# Patient Record
Sex: Female | Born: 1960 | Race: Black or African American | Hispanic: No | Smoking: Never smoker
Health system: Southern US, Community
[De-identification: ages and names within clinical notes are randomized; demographics above are authoritative.]

## PROBLEM LIST (undated history)

## (undated) DIAGNOSIS — I10 Essential (primary) hypertension: Secondary | ICD-10-CM

## (undated) DIAGNOSIS — E119 Type 2 diabetes mellitus without complications: Secondary | ICD-10-CM

## (undated) HISTORY — PX: PARTIAL HYSTERECTOMY: SHX80

---

## 2013-02-07 ENCOUNTER — Emergency Department: Payer: Self-pay | Admitting: Emergency Medicine

## 2014-10-16 ENCOUNTER — Ambulatory Visit: Payer: Self-pay | Admitting: Internal Medicine

## 2015-08-12 ENCOUNTER — Encounter: Payer: Self-pay | Admitting: Emergency Medicine

## 2015-08-12 ENCOUNTER — Emergency Department
Admission: EM | Admit: 2015-08-12 | Discharge: 2015-08-12 | Disposition: A | Discharge status: Home / Self Care | Payer: PRIVATE HEALTH INSURANCE | Attending: Emergency Medicine | Admitting: Emergency Medicine

## 2015-08-12 DIAGNOSIS — S3992XA Unspecified injury of lower back, initial encounter: Secondary | ICD-10-CM | POA: Diagnosis present

## 2015-08-12 DIAGNOSIS — S39012A Strain of muscle, fascia and tendon of lower back, initial encounter: Secondary | ICD-10-CM

## 2015-08-12 DIAGNOSIS — Y92410 Unspecified street and highway as the place of occurrence of the external cause: Secondary | ICD-10-CM | POA: Insufficient documentation

## 2015-08-12 DIAGNOSIS — Y998 Other external cause status: Secondary | ICD-10-CM | POA: Insufficient documentation

## 2015-08-12 DIAGNOSIS — I10 Essential (primary) hypertension: Secondary | ICD-10-CM | POA: Insufficient documentation

## 2015-08-12 DIAGNOSIS — Y9389 Activity, other specified: Secondary | ICD-10-CM | POA: Diagnosis not present

## 2015-08-12 DIAGNOSIS — E119 Type 2 diabetes mellitus without complications: Secondary | ICD-10-CM | POA: Diagnosis not present

## 2015-08-12 HISTORY — DX: Essential (primary) hypertension: I10

## 2015-08-12 HISTORY — DX: Type 2 diabetes mellitus without complications: E11.9

## 2015-08-12 MED ORDER — CYCLOBENZAPRINE HCL 10 MG PO TABS: 10.0000 mg | ORAL_TABLET | Freq: Three times a day (TID) | ORAL | Status: AC | PRN

## 2015-08-12 MED ORDER — IBUPROFEN 800 MG PO TABS: 800.0000 mg | ORAL_TABLET | Freq: Three times a day (TID) | ORAL | Status: AC | PRN

## 2015-08-12 MED ORDER — TRAMADOL HCL 50 MG PO TABS
50.0000 mg | ORAL_TABLET | Freq: Once | ORAL | Status: AC
  Administered 2015-08-12: 50 mg via ORAL
  Filled 2015-08-12: qty 1

## 2015-08-12 MED ORDER — CYCLOBENZAPRINE HCL 10 MG PO TABS
10.0000 mg | ORAL_TABLET | Freq: Once | ORAL | Status: AC
  Administered 2015-08-12: 10 mg via ORAL
  Filled 2015-08-12: qty 1

## 2015-08-12 MED ORDER — IBUPROFEN 800 MG PO TABS
800.0000 mg | ORAL_TABLET | Freq: Once | ORAL | Status: AC
  Administered 2015-08-12: 800 mg via ORAL
  Filled 2015-08-12: qty 1

## 2015-08-12 MED ORDER — TRAMADOL HCL 50 MG PO TABS: 50.0000 mg | ORAL_TABLET | Freq: Four times a day (QID) | ORAL | Status: AC | PRN
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
## 2015-08-12 NOTE — ED Notes (Signed)
Patient with no complaints at this time. Respirations even and unlabored. Skin warm/dry. Discharge instructions reviewed with patient at this time. Patient given opportunity to voice concerns/ask questions. Patient discharged at this time and left Emergency Department with steady gait.   

## 2015-10-28 ENCOUNTER — Encounter: Payer: Self-pay | Admitting: Emergency Medicine

## 2015-10-28 ENCOUNTER — Emergency Department
Admission: EM | Admit: 2015-10-28 | Discharge: 2015-10-28 | Disposition: A | Discharge status: Home / Self Care | Payer: PRIVATE HEALTH INSURANCE | Attending: Emergency Medicine | Admitting: Emergency Medicine

## 2015-10-28 DIAGNOSIS — K529 Noninfective gastroenteritis and colitis, unspecified: Secondary | ICD-10-CM | POA: Diagnosis not present

## 2015-10-28 DIAGNOSIS — I10 Essential (primary) hypertension: Secondary | ICD-10-CM | POA: Diagnosis not present

## 2015-10-28 DIAGNOSIS — Z3202 Encounter for pregnancy test, result negative: Secondary | ICD-10-CM | POA: Diagnosis not present

## 2015-10-28 DIAGNOSIS — E119 Type 2 diabetes mellitus without complications: Secondary | ICD-10-CM | POA: Diagnosis not present

## 2015-10-28 DIAGNOSIS — R112 Nausea with vomiting, unspecified: Secondary | ICD-10-CM | POA: Diagnosis present

## 2015-10-28 LAB — URINALYSIS COMPLETE WITH MICROSCOPIC (ARMC ONLY)
BILIRUBIN URINE: NEGATIVE
Bacteria, UA: NONE SEEN
GLUCOSE, UA: 150 mg/dL — AB
Hgb urine dipstick: NEGATIVE
KETONES UR: NEGATIVE mg/dL
Leukocytes, UA: NEGATIVE
Nitrite: NEGATIVE
Protein, ur: 30 mg/dL — AB
Specific Gravity, Urine: 1.021 (ref 1.005–1.030)
pH: 8 (ref 5.0–8.0)

## 2015-10-28 LAB — COMPREHENSIVE METABOLIC PANEL
ALK PHOS: 69 U/L (ref 38–126)
ALT: 18 U/L (ref 14–54)
AST: 16 U/L (ref 15–41)
Albumin: 4.1 g/dL (ref 3.5–5.0)
Anion gap: 9 (ref 5–15)
BUN: 13 mg/dL (ref 6–20)
CALCIUM: 9.4 mg/dL (ref 8.9–10.3)
CO2: 26 mmol/L (ref 22–32)
CREATININE: 0.69 mg/dL (ref 0.44–1.00)
Chloride: 104 mmol/L (ref 101–111)
GFR calc non Af Amer: >60 mL/min (ref >60)
Glucose, Bld: 232 mg/dL — ABNORMAL HIGH (ref 65–99)
Potassium: 3.6 mmol/L (ref 3.5–5.1)
SODIUM: 139 mmol/L (ref 135–145)
Total Bilirubin: 0.6 mg/dL (ref 0.3–1.2)
Total Protein: 7.7 g/dL (ref 6.5–8.1)

## 2015-10-28 LAB — CBC
HCT: 38.8 % (ref 35.0–47.0)
Hemoglobin: 12.7 g/dL (ref 12.0–16.0)
MCH: 27.6 pg (ref 26.0–34.0)
MCHC: 32.8 g/dL (ref 32.0–36.0)
MCV: 84.1 fL (ref 80.0–100.0)
PLATELETS: 248 10*3/uL (ref 150–440)
RBC: 4.61 MIL/uL (ref 3.80–5.20)
RDW: 14.1 % (ref 11.5–14.5)
WBC: 9.6 10*3/uL (ref 3.6–11.0)

## 2015-10-28 LAB — GLUCOSE, CAPILLARY: Glucose-Capillary: 205 mg/dL — ABNORMAL HIGH (ref 65–99)

## 2015-10-28 LAB — LIPASE, BLOOD: Lipase: 16 U/L (ref 11–51)

## 2015-10-28 LAB — POCT PREGNANCY, URINE: PREG TEST UR: NEGATIVE

## 2015-10-28 MED ORDER — ONDANSETRON HCL 4 MG/2ML IJ SOLN
4.0000 mg | Freq: Once | INTRAMUSCULAR | Status: AC
  Administered 2015-10-28: 4 mg via INTRAVENOUS
  Filled 2015-10-28: qty 2

## 2015-10-28 MED ORDER — ONDANSETRON HCL 4 MG PO TABS: 4.0000 mg | ORAL_TABLET | Freq: Every day | ORAL | Status: DC | PRN

## 2015-10-28 MED ORDER — MORPHINE SULFATE (PF) 4 MG/ML IV SOLN
4.0000 mg | Freq: Once | INTRAVENOUS | Status: AC
  Administered 2015-10-28: 4 mg via INTRAVENOUS
  Filled 2015-10-28: qty 1

## 2015-10-28 MED ORDER — ONDANSETRON HCL 4 MG/2ML IJ SOLN
INTRAMUSCULAR | Status: AC
  Administered 2015-10-28: 4 mg via INTRAVENOUS
  Filled 2015-10-28: qty 2

## 2015-10-28 MED ORDER — DICYCLOMINE HCL 20 MG PO TABS: 9.0000 mg | ORAL_TABLET | Freq: Three times a day (TID) | ORAL | Status: AC | PRN

## 2015-10-28 MED ORDER — ONDANSETRON HCL 4 MG/2ML IJ SOLN
4.0000 mg | Freq: Once | INTRAMUSCULAR | Status: AC
  Administered 2015-10-28: 4 mg via INTRAVENOUS

## 2015-10-28 MED ORDER — SODIUM CHLORIDE 0.9 % IV SOLN
Freq: Once | INTRAVENOUS | Status: AC
  Administered 2015-10-28: 15:00:00 via INTRAVENOUS

## 2015-10-28 NOTE — ED Notes (Signed)
Pt presents with vomiting starting this am. Pt is diabetic and has not been able to eat or take her insulin.

## 2015-10-28 NOTE — ED Provider Notes (Addendum)
[facility] Emergency Department Provider Note     Time seen: ----------------------------------------- 2:23 PM on 10/28/2015 -----------------------------------------    I have reviewed the triage vital signs and the nursing notes.   HISTORY  Chief Complaint Emesis    HPI Jamie Fuller is a 55 y.o. female brought to the ER for nausea and vomiting and diarrhea that started this morning. Patient is diabetic and she is concerned about her blood sugar. She has a meal to eat anything due to the excessive vomiting, she states the diarrhea has stopped for now. She denies any specific abdominal pain, has not had a history of this. Nothing makes her symptoms better or worse.   Past Medical History  Diagnosis Date  . Diabetes mellitus without complication (HCC)   . Hypertension     There are no active problems to display for this patient.   Past Surgical History  Procedure Laterality Date  . Partial hysterectomy      Allergies Review of patient's allergies indicates no known allergies.  Social History Social History  Substance Use Topics  . Smoking status: Never Smoker   . Smokeless tobacco: None  . Alcohol Use: Yes    Review of Systems Constitutional: Negative for fever. Eyes: Negative for visual changes. ENT: Negative for sore throat. Cardiovascular: Negative for chest pain. Respiratory: Negative for shortness of breath. Gastrointestinal: Negative for abdominal pain, positive for vomiting and diarrhea Genitourinary: Negative for dysuria. Musculoskeletal: Negative for back pain. Skin: Negative for rash. Neurological: Negative for headaches, focal weakness or numbness.  10-point ROS otherwise negative.  ____________________________________________   PHYSICAL EXAM:  VITAL SIGNS: ED Triage Vitals  Enc Vitals Group     BP 10/28/15 1228 161/76 mmHg     Pulse Rate 10/28/15 1228 115     Resp 10/28/15 1228 18     Temp 10/28/15 1228 100.2  F (37.9 C)     Temp Source 10/28/15 1228 Oral     SpO2 10/28/15 1228 100 %     Weight 10/28/15 1228 181 lb (82.101 kg)     Height 10/28/15 1228 5\' 3"  (1.6 m)     Head Cir --      Peak Flow --      Pain Score 10/28/15 1229 7     Pain Loc --      Pain Edu? --      Excl. in GC? --     Constitutional: Alert and oriented. Well appearing and in no distress. Eyes: Conjunctivae are normal. PERRL. Normal extraocular movements. ENT   Head: Normocephalic and atraumatic.   Nose: No congestion/rhinnorhea.   Mouth/Throat: Mucous membranes are moist.   Neck: No stridor. Cardiovascular: Normal rate, regular rhythm. Normal and symmetric distal pulses are present in all extremities. No murmurs, rubs, or gallops. Respiratory: Normal respiratory effort without tachypnea nor retractions. Breath sounds are clear and equal bilaterally. No wheezes/rales/rhonchi. Gastrointestinal: Soft and nontender. No distention. No abdominal bruits.  Musculoskeletal: Nontender with normal range of motion in all extremities. No joint effusions.  No lower extremity tenderness nor edema. Neurologic:  Normal speech and language. No gross focal neurologic deficits are appreciated. Speech is normal.  Skin:  Skin is warm, dry and intact. No rash noted. Psychiatric: Mood and affect are normal. Speech and behavior are normal. Patient exhibits appropriate insight and judgment. ____________________________________________  ED COURSE:  Pertinent labs & imaging results that were available during my care of the patient were reviewed by me and considered in my medical decision  making (see chart for details). Patient is in no acute distress, will give her IV fluids and antiemetics and check basic labs. ____________________________________________    LABS (pertinent positives/negatives)  Labs Reviewed  COMPREHENSIVE METABOLIC PANEL - Abnormal; Notable for the following:    Glucose, Bld 232 (*)    All other components  within normal limits  URINALYSIS COMPLETEWITH MICROSCOPIC ([facility] ONLY) - Abnormal; Notable for the following:    Color, Urine YELLOW (*)    APPearance CLEAR (*)    Glucose, UA 150 (*)    Protein, ur 30 (*)    Squamous Epithelial / LPF 0-5 (*)    All other components within normal limits  GLUCOSE, CAPILLARY - Abnormal; Notable for the following:    Glucose-Capillary 205 (*)    All other components within normal limits  LIPASE, BLOOD  CBC  CBG MONITORING, ED  POCT PREGNANCY, URINE   ____________________________________________  FINAL ASSESSMENT AND PLAN  Gastroenteritis  Plan: Patient with labs and imaging as dictated above. Patient clinically with no oral virus infection. She is received saline and antiemetics and will continue home with same. She stable for outpatient follow-up.   Emily FilbertWilliams, Shaneese Tait E, MD   Emily FilbertJonathan E Jayen Bromwell, MD 10/28/15 1424  Emily FilbertJonathan E Arieona Swaggerty, MD 10/28/15 [phone]

## 2015-10-28 NOTE — Discharge Instructions (Signed)
Norovirus Infection °A norovirus infection is caused by exposure to a virus in a group of similar viruses (noroviruses). This type of infection causes inflammation in your stomach and intestines (gastroenteritis). Norovirus is the most common cause of gastroenteritis. It also causes food poisoning. °Anyone can get a norovirus infection. It spreads very easily (contagious). You can get it from contaminated food, water, surfaces, or other people. Norovirus is found in the stool or vomit of infected people. You can spread the infection as soon as you feel sick until 2 weeks after you recover.  °Symptoms usually begin within 2 days after you become infected. Most norovirus symptoms affect the digestive system. °CAUSES °Norovirus infection is caused by contact with norovirus. You can catch norovirus if you: °· Eat or drink something contaminated with norovirus. °· Touch surfaces or objects contaminated with norovirus and then put your hand in your mouth. °· Have direct contact with an infected person who has symptoms. °· Share food, drink, or utensils with someone with who is sick with norovirus. °SIGNS AND SYMPTOMS °Symptoms of norovirus may include: °· Nausea. °· Vomiting. °· Diarrhea. °· Stomach cramps. °· Fever. °· Chills. °· Headache. °· Muscle aches. °· Tiredness. °DIAGNOSIS °Your health care provider may suspect norovirus based on your symptoms and physical exam. Your health care provider may also test a sample of your stool or vomit for the virus.  °TREATMENT °There is no specific treatment for norovirus. Most people get better without treatment in about 2 days. °HOME CARE INSTRUCTIONS °· Replace lost fluids by drinking plenty of water or rehydration fluids containing important minerals called electrolytes. This prevents dehydration. Drink enough fluid to keep your urine clear or pale yellow. °· Do not prepare food for others while you are infected. Wait at least 3 days after recovering from the illness to do  that. °PREVENTION  °· Wash your hands often, especially after using the toilet or changing a diaper. °· Wash fruits and vegetables thoroughly before preparing or serving them. °· Throw out any food that a sick person may have touched. °· Disinfect contaminated surfaces immediately after someone in the household has been sick. Use a bleach-based household cleaner. °· Immediately remove and wash soiled clothes or sheets. °SEEK MEDICAL CARE IF: °· Your vomiting, diarrhea, and stomach pain is getting worse. °· Your symptoms of norovirus do not go away after 2-3 days. °SEEK IMMEDIATE MEDICAL CARE IF:  °You develop symptoms of dehydration that do not improve with fluid replacement. This may include: °· Excessive sleepiness. °· Lack of tears. °· Dry mouth. °· Dizziness when standing. °· Weak pulse. °  °This information is not intended to replace advice given to you by your health care provider. Make sure you discuss any questions you have with your health care provider. °  °Document Released: 12/19/2002 Document Revised: 10/19/2014 Document Reviewed: 03/08/2014 °Elsevier Interactive Patient Education ©2016 [employer] ° °

## 2015-12-03 ENCOUNTER — Other Ambulatory Visit: Payer: Self-pay | Admitting: Internal Medicine

## 2015-12-03 DIAGNOSIS — Z1239 Encounter for other screening for malignant neoplasm of breast: Secondary | ICD-10-CM

## 2015-12-19 ENCOUNTER — Ambulatory Visit: Payer: PRIVATE HEALTH INSURANCE

## 2015-12-31 ENCOUNTER — Ambulatory Visit
Admission: RE | Admit: 2015-12-31 | Discharge: 2015-12-31 | Disposition: A | Discharge status: Home / Self Care | Payer: PRIVATE HEALTH INSURANCE | Source: Ambulatory Visit | Attending: Internal Medicine | Admitting: Internal Medicine

## 2015-12-31 DIAGNOSIS — Z1231 Encounter for screening mammogram for malignant neoplasm of breast: Secondary | ICD-10-CM | POA: Insufficient documentation

## 2015-12-31 DIAGNOSIS — Z1239 Encounter for other screening for malignant neoplasm of breast: Secondary | ICD-10-CM

## 2016-02-21 ENCOUNTER — Encounter: Payer: Self-pay | Admitting: *Deleted

## 2016-02-24 ENCOUNTER — Ambulatory Visit: Payer: PRIVATE HEALTH INSURANCE | Admitting: Anesthesiology

## 2016-02-24 ENCOUNTER — Encounter
Admission: RE | Disposition: A | Discharge status: Home / Self Care | Payer: Self-pay | Source: Ambulatory Visit | Attending: Gastroenterology

## 2016-02-24 ENCOUNTER — Encounter: Payer: Self-pay | Admitting: *Deleted

## 2016-02-24 ENCOUNTER — Ambulatory Visit
Admission: RE | Admit: 2016-02-24 | Discharge: 2016-02-24 | Disposition: A | Discharge status: Home / Self Care | Payer: PRIVATE HEALTH INSURANCE | Source: Ambulatory Visit | Attending: Gastroenterology | Admitting: Gastroenterology

## 2016-02-24 DIAGNOSIS — I10 Essential (primary) hypertension: Secondary | ICD-10-CM | POA: Diagnosis not present

## 2016-02-24 DIAGNOSIS — E119 Type 2 diabetes mellitus without complications: Secondary | ICD-10-CM | POA: Diagnosis not present

## 2016-02-24 DIAGNOSIS — Z1211 Encounter for screening for malignant neoplasm of colon: Secondary | ICD-10-CM | POA: Insufficient documentation

## 2016-02-24 DIAGNOSIS — Z794 Long term (current) use of insulin: Secondary | ICD-10-CM | POA: Diagnosis not present

## 2016-02-24 DIAGNOSIS — Z90710 Acquired absence of both cervix and uterus: Secondary | ICD-10-CM | POA: Diagnosis not present

## 2016-02-24 HISTORY — PX: COLONOSCOPY WITH PROPOFOL: SHX5780

## 2016-02-24 LAB — GLUCOSE, CAPILLARY: GLUCOSE-CAPILLARY: 130 mg/dL — AB (ref 65–99)

## 2016-02-24 SURGERY — COLONOSCOPY WITH PROPOFOL
Anesthesia: General

## 2016-02-24 MED ORDER — LIDOCAINE 2% (20 MG/ML) 5 ML SYRINGE
INTRAMUSCULAR | Status: DC | PRN
  Administered 2016-02-24: 30 mg via INTRAVENOUS

## 2016-02-24 MED ORDER — SODIUM CHLORIDE 0.9 % IV SOLN: INTRAVENOUS | Status: DC

## 2016-02-24 MED ORDER — PROPOFOL 10 MG/ML IV BOLUS
INTRAVENOUS | Status: DC | PRN
  Administered 2016-02-24: 100 mg via INTRAVENOUS

## 2016-02-24 MED ORDER — MIDAZOLAM HCL 5 MG/5ML IJ SOLN
INTRAMUSCULAR | Status: DC | PRN
  Administered 2016-02-24: 1 mg via INTRAVENOUS

## 2016-02-24 MED ORDER — SODIUM CHLORIDE 0.9 % IV SOLN
INTRAVENOUS | Status: DC
  Administered 2016-02-24 (×2): via INTRAVENOUS

## 2016-02-24 MED ORDER — LIDOCAINE HCL (CARDIAC) 20 MG/ML IV SOLN: INTRAVENOUS | Status: DC | PRN

## 2016-02-24 MED ORDER — PROPOFOL 500 MG/50ML IV EMUL
INTRAVENOUS | Status: DC | PRN
  Administered 2016-02-24: 180 ug/kg/min via INTRAVENOUS

## 2016-02-24 MED ORDER — FENTANYL CITRATE (PF) 100 MCG/2ML IJ SOLN
INTRAMUSCULAR | Status: DC | PRN
  Administered 2016-02-24: 50 ug via INTRAVENOUS

## 2016-02-24 NOTE — Anesthesia Preprocedure Evaluation (Signed)
Anesthesia Evaluation  Patient identified by MRN, date of birth, ID band Patient awake    Reviewed: Allergy & Precautions, H&P , NPO status , Patient's Chart, lab work & pertinent test results, reviewed documented beta blocker date and time   Airway Mallampati: II   Neck ROM: full    Dental  (+) Teeth Intact   Pulmonary neg pulmonary ROS,    Pulmonary exam normal        Cardiovascular hypertension, negative cardio ROS Normal cardiovascular exam Rhythm:regular Rate:Normal     Neuro/Psych negative neurological ROS  negative psych ROS   GI/Hepatic negative GI ROS, Neg liver ROS,   Endo/Other  negative endocrine ROSdiabetes  Renal/GU negative Renal ROS  negative genitourinary   Musculoskeletal   Abdominal   Peds  Hematology negative hematology ROS (+)   Anesthesia Other Findings Past Medical History:   Diabetes mellitus without complication (HCC)                 Hypertension                                               Past Surgical History:   PARTIAL HYSTERECTOMY                                        BMI    Body Mass Index   32.07 kg/m 2     Reproductive/Obstetrics                             Anesthesia Physical Anesthesia Plan  ASA: II  Anesthesia Plan: General   Post-op Pain Management:    Induction:   Airway Management Planned:   Additional Equipment:   Intra-op Plan:   Post-operative Plan:   Informed Consent: I have reviewed the patients History and Physical, chart, labs and discussed the procedure including the risks, benefits and alternatives for the proposed anesthesia with the patient or authorized representative who has indicated his/her understanding and acceptance.   Dental Advisory Given  Plan Discussed with: CRNA  Anesthesia Plan Comments:         Anesthesia Quick Evaluation

## 2016-02-24 NOTE — H&P (Signed)
Primary Care Physician:  Leotis ShamesSingh,Jasmine, MD Primary Gastroenterologist:  Dr. Bluford Kaufmannh  Pre-Procedure History & Physical: HPI:  Jamie Fuller is a 55 y.o. female is here for an colonoscopy.  Past Medical History  Diagnosis Date  . Diabetes mellitus without complication (HCC)   . Hypertension     Past Surgical History  Procedure Laterality Date  . Partial hysterectomy      Prior to Admission medications   Medication Sig Start Date End Date Taking? Authorizing Provider  clotrimazole-betamethasone (LOTRISONE) cream Apply 1 application topically 2 (two) times daily.   Yes Historical Provider, MD  cyclobenzaprine (FLEXERIL) 5 MG tablet Take 5 mg by mouth 3 (three) times daily.   Yes Historical Provider, MD  fluticasone (FLONASE) 50 MCG/ACT nasal spray Place into both nostrils daily.   Yes Historical Provider, MD  insulin glargine (LANTUS) 100 UNIT/ML injection Inject 75 Units into the skin at bedtime.   Yes Historical Provider, MD  levocetirizine (XYZAL) 5 MG tablet Take 5 mg by mouth every evening.   Yes Historical Provider, MD  lisinopril-hydrochlorothiazide (PRINZIDE,ZESTORETIC) 20-25 MG tablet Take 1 tablet by mouth daily.   Yes Historical Provider, MD  meloxicam (MOBIC) 15 MG tablet Take 15 mg by mouth daily.   Yes Historical Provider, MD  metFORMIN (GLUCOPHAGE) 1000 MG tablet Take 1,000 mg by mouth 2 (two) times daily with a meal.   Yes Historical Provider, MD  cyclobenzaprine (FLEXERIL) 10 MG tablet Take 1 tablet (10 mg total) by mouth every 8 (eight) hours as needed for muscle spasms. 08/12/15   Joni Reiningonald K Smith, PA-C  dicyclomine (BENTYL) 20 MG tablet Take 0.5 tablets (10 mg total) by mouth 3 (three) times daily as needed for spasms. 10/28/15   Emily FilbertJonathan E Williams, MD  ibuprofen (ADVIL,MOTRIN) 800 MG tablet Take 1 tablet (800 mg total) by mouth every 8 (eight) hours as needed for moderate pain. 08/12/15   Joni Reiningonald K Smith, PA-C  ondansetron (ZOFRAN) 4 MG tablet Take 1 tablet (4 mg total) by  mouth daily as needed for nausea or vomiting. 10/28/15   Emily FilbertJonathan E Williams, MD  traMADol (ULTRAM) 50 MG tablet Take 1 tablet (50 mg total) by mouth every 6 (six) hours as needed for moderate pain. 08/12/15   Joni Reiningonald K Smith, PA-C    Allergies as of 02/11/2016  . (No Known Allergies)    History reviewed. No pertinent family history.  Social History   Social History  . Marital Status: Divorced    Spouse Name: N/A  . Number of Children: N/A  . Years of Education: N/A   Occupational History  . Not on file.   Social History Main Topics  . Smoking status: Never Smoker   . Smokeless tobacco: Not on file  . Alcohol Use: Yes  . Drug Use: No  . Sexual Activity: Not on file   Other Topics Concern  . Not on file   Social History Narrative    Review of Systems: See HPI, otherwise negative ROS  Physical Exam: BP 147/79 mmHg  Pulse 93  Temp(Src) 97.8 F (36.6 C) (Tympanic)  Resp 14  Ht 5\' 3"  (1.6 m)  Wt 181 lb (82.101 kg)  BMI 32.07 kg/m2  SpO2 100% General:   Alert,  pleasant and cooperative in NAD Head:  Normocephalic and atraumatic. Neck:  Supple; no masses or thyromegaly. Lungs:  Clear throughout to auscultation.    Heart:  Regular rate and rhythm. Abdomen:  Soft, nontender and nondistended. Normal bowel sounds, without guarding,  and without rebound.   Neurologic:  Alert and  oriented x4;  grossly normal neurologically.  Impression/Plan: Jamie Fuller is here for an colonoscpy to be performed for screening  Risks, benefits, limitations, and alternatives regarding  colonoscopy have been reviewed with the patient.  Questions have been answered.  All parties agreeable.   Jamie Fuller, Ezzard Standing, MD  02/24/2016, 7:48 AM

## 2016-02-24 NOTE — Transfer of Care (Signed)
Immediate Anesthesia Transfer of Care Note  Patient: Jamie Fuller  Procedure(s) Performed: Procedure(s): COLONOSCOPY WITH PROPOFOL (N/A)  Patient Location: PACU and Endoscopy Unit  Anesthesia Type:General  Level of Consciousness: sedated  Airway & Oxygen Therapy: Patient Spontanous Breathing and Patient connected to nasal cannula oxygen  Post-op Assessment: Report given to RN and Post -op Vital signs reviewed and stable  Post vital signs: Reviewed and stable  Last Vitals:  Filed Vitals:   02/24/16 0703  BP: 147/79  Pulse: 93  Temp: 36.6 C  Resp: 14    Last Pain: There were no vitals filed for this visit.       Complications: No apparent anesthesia complications

## 2016-02-24 NOTE — Anesthesia Postprocedure Evaluation (Signed)
Anesthesia Post Note  Patient: Jamie MassonLethia H Fuller  Procedure(s) Performed: Procedure(s) (LRB): COLONOSCOPY WITH PROPOFOL (N/A)  Patient location during evaluation: PACU Anesthesia Type: General Level of consciousness: awake and alert Pain management: pain level controlled Vital Signs Assessment: post-procedure vital signs reviewed and stable Respiratory status: spontaneous breathing, nonlabored ventilation, respiratory function stable and patient connected to nasal cannula oxygen Cardiovascular status: blood pressure returned to baseline and stable Postop Assessment: no signs of nausea or vomiting Anesthetic complications: no    Last Vitals:  Filed Vitals:   02/24/16 0830 02/24/16 0840  BP: 111/81 124/77  Pulse: 86 78  Temp:    Resp: 19 14    Last Pain: There were no vitals filed for this visit.               Yevette EdwardsJames G Adams

## 2016-02-24 NOTE — Op Note (Signed)
[facility] Gastroenterology Patient Name: Jamie PeersLethia Sacca Procedure Date: 02/24/2016 7:43 AM MRN: [id number] Account #: [id number] Date of Birth: [birth year] Admit Type: Outpatient Age: 55 Room: [facility] ENDO ROOM 4 Gender: Female Note Status: Finalized Procedure:            Colonoscopy Indications:          Screening for colorectal malignant neoplasm Providers:            Ezzard StandingPaul Y. Bluford Kaufmannh, MD Referring MD:         Leotis ShamesJasmine Singh (Referring MD) Medicines:            Monitored Anesthesia Care Complications:        No immediate complications. Procedure:            Pre-Anesthesia Assessment:                       - Prior to the procedure, a History and Physical was                        performed, and patient medications, allergies and                        sensitivities were reviewed. The patient's tolerance of                        previous anesthesia was reviewed.                       - The risks and benefits of the procedure and the                        sedation options and risks were discussed with the                        patient. All questions were answered and informed                        consent was obtained.                       - After reviewing the risks and benefits, the patient                        was deemed in satisfactory condition to undergo the                        procedure.                       After obtaining informed consent, the colonoscope was                        passed under direct vision. Throughout the procedure,                        the patient's blood pressure, pulse, and oxygen                        saturations were monitored continuously. The                        Colonoscope  was introduced through the anus and                        advanced to the the cecum, identified by appendiceal                        orifice and ileocecal valve. The patient tolerated the                        procedure well. The quality of the bowel  preparation                        was good. The colonoscopy was performed with difficulty                        due to significant looping. Successful completion of                        the procedure was aided by using manual pressure. Findings:      The colon (entire examined portion) appeared normal. Impression:           - The entire examined colon is normal.                       - No specimens collected. Recommendation:       - Discharge patient to home.                       - Repeat colonoscopy in 10 years for surveillance.                       - The findings and recommendations were discussed with                        the patient. Procedure Code(s):    --- Professional ---                       [phone], Colonoscopy, flexible; diagnostic, including                        collection of specimen(s) by brushing or washing, when                        performed (separate procedure) Diagnosis Code(s):    --- Professional ---                       Z12.11, Encounter for screening for malignant neoplasm                        of colon CPT copyright 2016 [employer]. All rights reserved. The codes documented in this report are preliminary and upon coder review may  be revised to meet current compliance requirements. Wallace Cullens, MD 02/24/2016 8:09:54 AM This report has been signed electronically. Number of Addenda: 0 Note Initiated On: 02/24/2016 7:43 AM Scope Withdrawal Time: 0 hours 7 minutes 33 seconds  Total Procedure Duration: 0 hours 10 minutes 21 seconds       [facility]

## 2016-02-26 ENCOUNTER — Encounter: Payer: Self-pay | Admitting: Gastroenterology

## 2016-11-30 ENCOUNTER — Other Ambulatory Visit: Payer: Self-pay | Admitting: Internal Medicine

## 2016-11-30 DIAGNOSIS — Z1231 Encounter for screening mammogram for malignant neoplasm of breast: Secondary | ICD-10-CM

## 2016-12-31 ENCOUNTER — Ambulatory Visit
Admission: RE | Admit: 2016-12-31 | Discharge: 2016-12-31 | Disposition: A | Discharge status: Home / Self Care | Payer: PRIVATE HEALTH INSURANCE | Source: Ambulatory Visit | Attending: Internal Medicine | Admitting: Internal Medicine

## 2016-12-31 DIAGNOSIS — Z1231 Encounter for screening mammogram for malignant neoplasm of breast: Secondary | ICD-10-CM

## 2017-12-01 ENCOUNTER — Other Ambulatory Visit: Payer: Self-pay | Admitting: Internal Medicine

## 2017-12-01 DIAGNOSIS — Z1231 Encounter for screening mammogram for malignant neoplasm of breast: Secondary | ICD-10-CM

## 2018-01-10 ENCOUNTER — Ambulatory Visit
Admission: RE | Admit: 2018-01-10 | Discharge: 2018-01-10 | Disposition: A | Discharge status: Home / Self Care | Payer: PRIVATE HEALTH INSURANCE | Source: Ambulatory Visit | Attending: Internal Medicine | Admitting: Internal Medicine

## 2018-01-10 DIAGNOSIS — Z1231 Encounter for screening mammogram for malignant neoplasm of breast: Secondary | ICD-10-CM | POA: Diagnosis not present

## 2018-12-06 ENCOUNTER — Other Ambulatory Visit: Payer: Self-pay | Admitting: Internal Medicine

## 2018-12-06 DIAGNOSIS — Z1231 Encounter for screening mammogram for malignant neoplasm of breast: Secondary | ICD-10-CM

## 2019-04-26 ENCOUNTER — Other Ambulatory Visit: Payer: Self-pay

## 2019-04-26 ENCOUNTER — Ambulatory Visit
Admission: RE | Admit: 2019-04-26 | Discharge: 2019-04-26 | Disposition: A | Discharge status: Home / Self Care | Payer: Managed Care, Other (non HMO) | Source: Ambulatory Visit | Attending: Internal Medicine | Admitting: Internal Medicine

## 2019-04-26 DIAGNOSIS — Z1231 Encounter for screening mammogram for malignant neoplasm of breast: Secondary | ICD-10-CM | POA: Diagnosis present

## 2020-04-12 ENCOUNTER — Other Ambulatory Visit: Payer: Self-pay | Admitting: Infectious Diseases

## 2020-04-12 DIAGNOSIS — Z1231 Encounter for screening mammogram for malignant neoplasm of breast: Secondary | ICD-10-CM

## 2020-04-26 ENCOUNTER — Ambulatory Visit
Admission: RE | Admit: 2020-04-26 | Discharge: 2020-04-26 | Disposition: A | Discharge status: Home / Self Care | Payer: Managed Care, Other (non HMO) | Source: Ambulatory Visit | Attending: Infectious Diseases | Admitting: Infectious Diseases

## 2020-04-26 DIAGNOSIS — Z1231 Encounter for screening mammogram for malignant neoplasm of breast: Secondary | ICD-10-CM

## 2020-09-11 ENCOUNTER — Encounter: Payer: Self-pay | Admitting: Emergency Medicine

## 2020-09-11 ENCOUNTER — Other Ambulatory Visit: Payer: Self-pay

## 2020-09-11 DIAGNOSIS — Z79899 Other long term (current) drug therapy: Secondary | ICD-10-CM | POA: Insufficient documentation

## 2020-09-11 DIAGNOSIS — U071 COVID-19: Secondary | ICD-10-CM | POA: Diagnosis not present

## 2020-09-11 DIAGNOSIS — R110 Nausea: Secondary | ICD-10-CM | POA: Diagnosis present

## 2020-09-11 DIAGNOSIS — Z794 Long term (current) use of insulin: Secondary | ICD-10-CM | POA: Insufficient documentation

## 2020-09-11 DIAGNOSIS — I10 Essential (primary) hypertension: Secondary | ICD-10-CM | POA: Insufficient documentation

## 2020-09-11 DIAGNOSIS — E860 Dehydration: Secondary | ICD-10-CM | POA: Diagnosis not present

## 2020-09-11 DIAGNOSIS — E119 Type 2 diabetes mellitus without complications: Secondary | ICD-10-CM | POA: Insufficient documentation

## 2020-09-11 LAB — URINALYSIS, COMPLETE (UACMP) WITH MICROSCOPIC
Bacteria, UA: NONE SEEN
Bilirubin Urine: NEGATIVE
Glucose, UA: >=500 mg/dL — AB
Hgb urine dipstick: NEGATIVE
Ketones, ur: NEGATIVE mg/dL
Leukocytes,Ua: NEGATIVE
Nitrite: NEGATIVE
Protein, ur: NEGATIVE mg/dL
Specific Gravity, Urine: 1.028 (ref 1.005–1.030)
pH: 5 (ref 5.0–8.0)

## 2020-09-11 LAB — CBC
HCT: 37.2 % (ref 36.0–46.0)
Hemoglobin: 12.4 g/dL (ref 12.0–15.0)
MCH: 29 pg (ref 26.0–34.0)
MCHC: 33.3 g/dL (ref 30.0–36.0)
MCV: 86.9 fL (ref 80.0–100.0)
Platelets: 158 10*3/uL (ref 150–400)
RBC: 4.28 MIL/uL (ref 3.87–5.11)
RDW: 13.4 % (ref 11.5–15.5)
WBC: 3.7 10*3/uL — ABNORMAL LOW (ref 4.0–10.5)
nRBC: 0 % (ref 0.0–0.2)

## 2020-09-11 LAB — COMPREHENSIVE METABOLIC PANEL
ALT: 18 U/L (ref 0–44)
AST: 25 U/L (ref 15–41)
Albumin: 4 g/dL (ref 3.5–5.0)
Alkaline Phosphatase: 68 U/L (ref 38–126)
Anion gap: 10 (ref 5–15)
BUN: 8 mg/dL (ref 6–20)
CO2: 24 mmol/L (ref 22–32)
Calcium: 8.7 mg/dL — ABNORMAL LOW (ref 8.9–10.3)
Chloride: 100 mmol/L (ref 98–111)
Creatinine, Ser: 0.64 mg/dL (ref 0.44–1.00)
GFR, Estimated: >60 mL/min (ref >60)
Glucose, Bld: 126 mg/dL — ABNORMAL HIGH (ref 70–99)
Potassium: 3.6 mmol/L (ref 3.5–5.1)
Sodium: 134 mmol/L — ABNORMAL LOW (ref 135–145)
Total Bilirubin: 0.6 mg/dL (ref 0.3–1.2)
Total Protein: 7.1 g/dL (ref 6.5–8.1)

## 2020-09-11 LAB — CBG MONITORING, ED: Glucose-Capillary: 122 mg/dL — ABNORMAL HIGH (ref 70–99)

## 2020-09-11 LAB — LIPASE, BLOOD: Lipase: 25 U/L (ref 11–51)

## 2020-09-11 NOTE — ED Triage Notes (Signed)
Pt to ED from home c/o nausea, generalized weakness, hypoglycemia x2 days.  Denies fevers, urinary changes, SOB or pain.  States not having an appetite and eating as well recently.  Pt A&Ox4, chest rise even and unlabored, in NAD at this time.

## 2020-09-12 ENCOUNTER — Emergency Department
Admission: EM | Admit: 2020-09-12 | Discharge: 2020-09-12 | Disposition: A | Discharge status: Home / Self Care | Payer: Managed Care, Other (non HMO) | Attending: Emergency Medicine | Admitting: Emergency Medicine

## 2020-09-12 DIAGNOSIS — R110 Nausea: Secondary | ICD-10-CM

## 2020-09-12 DIAGNOSIS — E860 Dehydration: Secondary | ICD-10-CM

## 2020-09-12 DIAGNOSIS — U071 COVID-19: Secondary | ICD-10-CM

## 2020-09-12 LAB — RESP PANEL BY RT-PCR (FLU A&B, COVID) ARPGX2
Influenza A by PCR: NEGATIVE
Influenza B by PCR: NEGATIVE
SARS Coronavirus 2 by RT PCR: POSITIVE — AB

## 2020-09-12 MED ORDER — ONDANSETRON HCL 4 MG/2ML IJ SOLN
4.0000 mg | Freq: Once | INTRAMUSCULAR | Status: AC
  Administered 2020-09-12: 4 mg via INTRAVENOUS
  Filled 2020-09-12: qty 2

## 2020-09-12 MED ORDER — ACETAMINOPHEN 500 MG PO TABS
1000.0000 mg | ORAL_TABLET | Freq: Once | ORAL | Status: AC
  Administered 2020-09-12: 1000 mg via ORAL
  Filled 2020-09-12: qty 2

## 2020-09-12 MED ORDER — ONDANSETRON HCL 4 MG PO TABS: 4.0000 mg | ORAL_TABLET | Freq: Three times a day (TID) | ORAL | 0 refills | Status: AC | PRN

## 2020-09-12 MED ORDER — LACTATED RINGERS IV BOLUS
1000.0000 mL | Freq: Once | INTRAVENOUS | Status: AC
  Administered 2020-09-12: 1000 mL via INTRAVENOUS

## 2020-09-12 NOTE — ED Provider Notes (Signed)
[facility] Emergency Department Provider Note  ____________________________________________   First MD Initiated Contact with Patient 09/12/20 0209     (approximate)  I have reviewed the triage vital signs and the nursing notes.   HISTORY  Chief Complaint Hypoglycemia and Nausea   HPI Jamie Fuller is a 59 y.o. female with past medical history of DM and HTN who presents for assessment approximately 2 days of nausea decreased appetite and severe fatigue.  Patient denies any measured fevers at home, headache, earache, sore throat, chest pain, cough, shortness of breath, abdominal pain or back pain, urinary symptoms but does endorse a little bit of diarrhea.  She has no blood in her stool or urine.  She denies any myalgias, rash or focal extremity pain.  Denies any recent falls or injuries.  Denies any tobacco abuse, EtOH use or illicit drug use.  She has not been vaccinated and scope her influenza.  No other acute concerns at this time         Past Medical History:  Diagnosis Date  . Diabetes mellitus without complication (HCC)   . Hypertension     There are no problems to display for this patient.   Past Surgical History:  Procedure Laterality Date  . COLONOSCOPY WITH PROPOFOL N/A 02/24/2016   Procedure: COLONOSCOPY WITH PROPOFOL;  Surgeon: Wallace Cullens, MD;  Location: [facility] ENDOSCOPY;  Service: Gastroenterology;  Laterality: N/A;  . PARTIAL HYSTERECTOMY      Prior to Admission medications   Medication Sig Start Date End Date Taking? Authorizing Provider  clotrimazole-betamethasone (LOTRISONE) cream Apply 1 application topically 2 (two) times daily.    [provider]  cyclobenzaprine (FLEXERIL) 10 MG tablet Take 1 tablet (10 mg total) by mouth every 8 (eight) hours as needed for muscle spasms. 08/12/15   Joni Reining, PA-C  cyclobenzaprine (FLEXERIL) 5 MG tablet Take 5 mg by mouth 3 (three) times daily.    [provider]    dicyclomine (BENTYL) 20 MG tablet Take 0.5 tablets (10 mg total) by mouth 3 (three) times daily as needed for spasms. 10/28/15   Emily Filbert, MD  fluticasone (FLONASE) 50 MCG/ACT nasal spray Place into both nostrils daily.    [provider]  ibuprofen (ADVIL,MOTRIN) 800 MG tablet Take 1 tablet (800 mg total) by mouth every 8 (eight) hours as needed for moderate pain. 08/12/15   Joni Reining, PA-C  insulin glargine (LANTUS) 100 UNIT/ML injection Inject 75 Units into the skin at bedtime.    [provider]  levocetirizine (XYZAL) 5 MG tablet Take 5 mg by mouth every evening.    [provider]  lisinopril-hydrochlorothiazide (PRINZIDE,ZESTORETIC) 20-25 MG tablet Take 1 tablet by mouth daily.    [provider]  meloxicam (MOBIC) 15 MG tablet Take 15 mg by mouth daily.    [provider]  metFORMIN (GLUCOPHAGE) 1000 MG tablet Take 1,000 mg by mouth 2 (two) times daily with a meal.    [provider]  ondansetron (ZOFRAN) 4 MG tablet Take 1 tablet (4 mg total) by mouth every 8 (eight) hours as needed for up to 10 doses for nausea or vomiting. 09/12/20   Gilles Chiquito, MD  traMADol (ULTRAM) 50 MG tablet Take 1 tablet (50 mg total) by mouth every 6 (six) hours as needed for moderate pain. 08/12/15   Joni Reining, PA-C    Allergies Patient has no known allergies.  Family History  Problem Relation Age of  Onset  . Breast cancer Neg Hx     Social History Social History   Tobacco Use  . Smoking status: Never Smoker  . Smokeless tobacco: Never Used  Substance Use Topics  . Alcohol use: Yes  . Drug use: No    Review of Systems  Review of Systems  Constitutional: Positive for malaise/fatigue. Negative for chills and fever.  HENT: Negative for sore throat.   Eyes: Negative for pain.  Respiratory: Negative for cough and stridor.   Cardiovascular: Negative for chest pain.  Gastrointestinal: Positive for nausea. Negative  for vomiting.  Genitourinary: Negative for dysuria.  Musculoskeletal: Negative for myalgias.  Skin: Negative for rash.  Neurological: Negative for seizures, loss of consciousness and headaches.  Psychiatric/Behavioral: Negative for suicidal ideas.  All other systems reviewed and are negative.     ____________________________________________   PHYSICAL EXAM:  VITAL SIGNS: ED Triage Vitals [09/11/20 2009]  Enc Vitals Group     BP 113/62     Pulse Rate (!) 108     Resp 16     Temp 100.3 F (37.9 C)     Temp Source Oral     SpO2 97 %     Weight 160 lb (72.6 kg)     Height 5\' 3"  (1.6 m)     Head Circumference      Peak Flow      Pain Score 0     Pain Loc      Pain Edu?      Excl. in GC?    Vitals:   09/11/20 2009 09/12/20 0254  BP: 113/62 (!) 100/59  Pulse: (!) 108 96  Resp: 16 18  Temp: 100.3 F (37.9 C) 99.9 F (37.7 C)  SpO2: 97% 98%   Physical Exam Vitals and nursing note reviewed.  Constitutional:      General: She is not in acute distress.    Appearance: She is well-developed.  HENT:     Head: Normocephalic and atraumatic.     Right Ear: External ear normal.     Left Ear: External ear normal.     Nose: Nose normal.     Mouth/Throat:     Mouth: Mucous membranes are dry.  Eyes:     Conjunctiva/sclera: Conjunctivae normal.  Cardiovascular:     Rate and Rhythm: Regular rhythm. Tachycardia present.     Heart sounds: No murmur heard.   Pulmonary:     Effort: Pulmonary effort is normal. No respiratory distress.     Breath sounds: Normal breath sounds.  Abdominal:     Palpations: Abdomen is soft.     Tenderness: There is no abdominal tenderness.  Musculoskeletal:     Cervical back: Neck supple.  Skin:    General: Skin is warm and dry.     Capillary Refill: Capillary refill takes 2 to 3 seconds.  Neurological:     Mental Status: She is alert and oriented to person, place, and time.  Psychiatric:        Mood and Affect: Mood normal.       ____________________________________________   LABS (all labs ordered are listed, but only abnormal results are displayed)  Labs Reviewed  COMPREHENSIVE METABOLIC PANEL - Abnormal; Notable for the following components:      Result Value   Sodium 134 (*)    Glucose, Bld 126 (*)    Calcium 8.7 (*)    All other components within normal limits  CBC - Abnormal; Notable for the following components:  WBC 3.7 (*)    All other components within normal limits  URINALYSIS, COMPLETE (UACMP) WITH MICROSCOPIC - Abnormal; Notable for the following components:   Color, Urine YELLOW (*)    APPearance CLEAR (*)    Glucose, UA >=500 (*)    All other components within normal limits  CBG MONITORING, ED - Abnormal; Notable for the following components:   Glucose-Capillary 122 (*)    All other components within normal limits  RESP PANEL BY RT-PCR (FLU A&B, COVID) ARPGX2  LIPASE, BLOOD   ____________________________________________  ____________________________________________   PROCEDURES  Procedure(s) performed (including Critical Care):  .1-3 Lead EKG Interpretation Performed by: Gilles Chiquito, MD Authorized by: Gilles Chiquito, MD     Interpretation: normal     ECG rate assessment: normal     Rhythm: sinus rhythm     Ectopy: none     Conduction: normal       ____________________________________________   INITIAL IMPRESSION / ASSESSMENT AND PLAN / ED COURSE      Patient presents with [place] to history exam for assessment of 2 days of fatigue and nausea as well as decreased appetite and p.o. intake.  On arrival she is slightly tachycardic and has elevate temperature 100.3 with otherwise stable vital signs on room air.  Lipase 25 not consistent with pancreatitis.  CMP unremarkable for any evidence of a significant ultralight metabolic or liver function normalities.  CBC with mild leukopenia with a WBC count of 3.7 and otherwise no significant derangements.  UA with evidence of  glucosuria without evidence of infection.  Overall patient's presentation exam and work-up is highly suspicious for viral infection with COVID-19 very high on the differential as well as some dehydration.  Patient has no cardiopulmonary symptoms to suggest ACS, PE, pneumonia, myocarditis or arrhythmia.  She is not appear acutely anemic and her abdomen is soft nontender throughout and I would low suspicion for cystitis or other life-threatening intra-abdominal pathology at this time.  Patient is tolerating p.o.  She was given Zofran IV fluids and Tylenol in emergency room and on reassessment her heart rate had improved.  Given stable vital signs with otherwise reassuring exam work-up I believe patient is a for discharge with plan for close outpatient PCP follow-up.  Rx written for Zofran.  Discharge stable condition.  Return precautions was discussed.   ____________________________________________   FINAL CLINICAL IMPRESSION(S) / ED DIAGNOSES  Final diagnoses:  Nausea  Dehydration    Medications  lactated ringers bolus 1,000 mL (1,000 mLs Intravenous New Bag/Given 09/12/20 0249)  acetaminophen (TYLENOL) tablet 1,000 mg (1,000 mg Oral Given 09/12/20 0250)  ondansetron (ZOFRAN) injection 4 mg (4 mg Intravenous Given 09/12/20 0250)     ED Discharge Orders         Ordered    ondansetron (ZOFRAN) 4 MG tablet  Every 8 hours PRN        09/12/20 0305           Note:  This document was prepared using Dragon voice recognition software and may include unintentional dictation errors.   Gilles Chiquito, MD 09/12/20 [phone]

## 2020-09-13 ENCOUNTER — Telehealth: Payer: Self-pay | Admitting: Physician Assistant

## 2020-09-13 NOTE — Telephone Encounter (Signed)
Called to discuss with patient about Covid symptoms and the use of sotrovimab, bamlanivimab/etesevimab or casirivimab/imdevimab, a monoclonal antibody infusion for those with mild to moderate Covid symptoms and at a high risk of hospitalization.  Pt is qualified for this infusion at the [facility] due to; Specific high risk criteria : BMI > 25, Diabetes, Cardiovascular disease or hypertension and Other high risk medical condition per CDC:  high SVI   Test Message left to call back our hotline [phone]. Vm box full. My chart message sent if active on Mychart.   Cline Crock PA-C

## 2021-07-17 ENCOUNTER — Other Ambulatory Visit: Payer: Self-pay | Admitting: Infectious Diseases

## 2021-07-17 DIAGNOSIS — Z1231 Encounter for screening mammogram for malignant neoplasm of breast: Secondary | ICD-10-CM

## 2021-08-25 ENCOUNTER — Other Ambulatory Visit: Payer: Self-pay

## 2021-08-25 ENCOUNTER — Ambulatory Visit
Admission: RE | Admit: 2021-08-25 | Discharge: 2021-08-25 | Disposition: A | Discharge status: Home / Self Care | Payer: Managed Care, Other (non HMO) | Source: Ambulatory Visit | Attending: Infectious Diseases | Admitting: Infectious Diseases

## 2021-08-25 DIAGNOSIS — Z1231 Encounter for screening mammogram for malignant neoplasm of breast: Secondary | ICD-10-CM | POA: Diagnosis not present

## 2023-06-24 IMAGING — MG MM DIGITAL SCREENING BILAT W/ TOMO AND CAD
6 of 12 series · 6 of 36 positions shown · non-contrast
Comparison: Previous exam(s).

CLINICAL DATA: Screening.

EXAM:
DIGITAL SCREENING BILATERAL MAMMOGRAM WITH TOMOSYNTHESIS AND CAD
TECHNIQUE: Bilateral screening digital craniocaudal and mediolateral oblique
mammograms were obtained. Bilateral screening digital breast
tomosynthesis was performed. The images were evaluated with
computer-aided detection.

[L MLO synth-2D (1 of 2)]
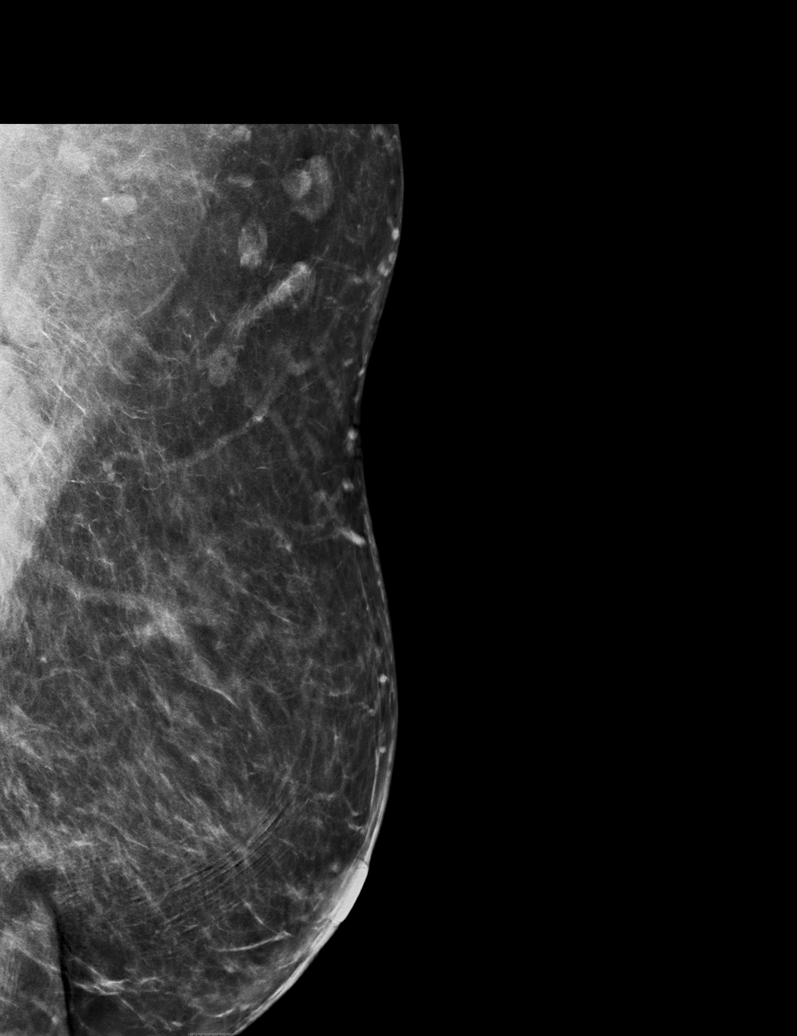

[L CC synth-2D]
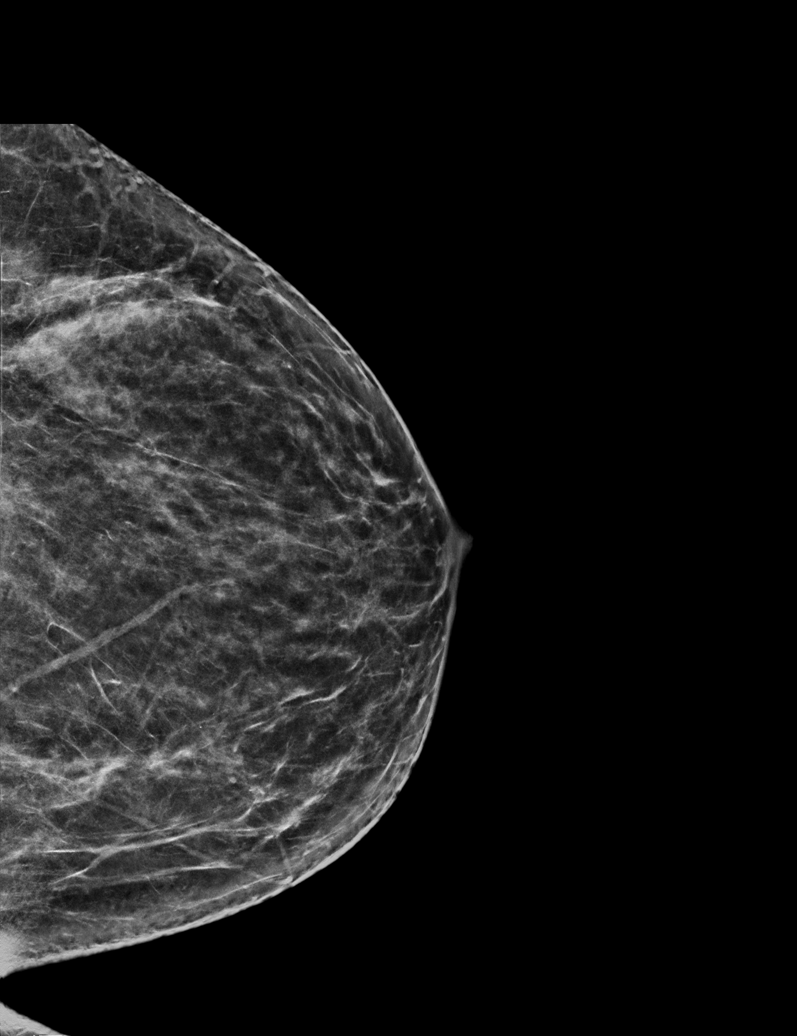

[L MLO synth-2D (2 of 2)]
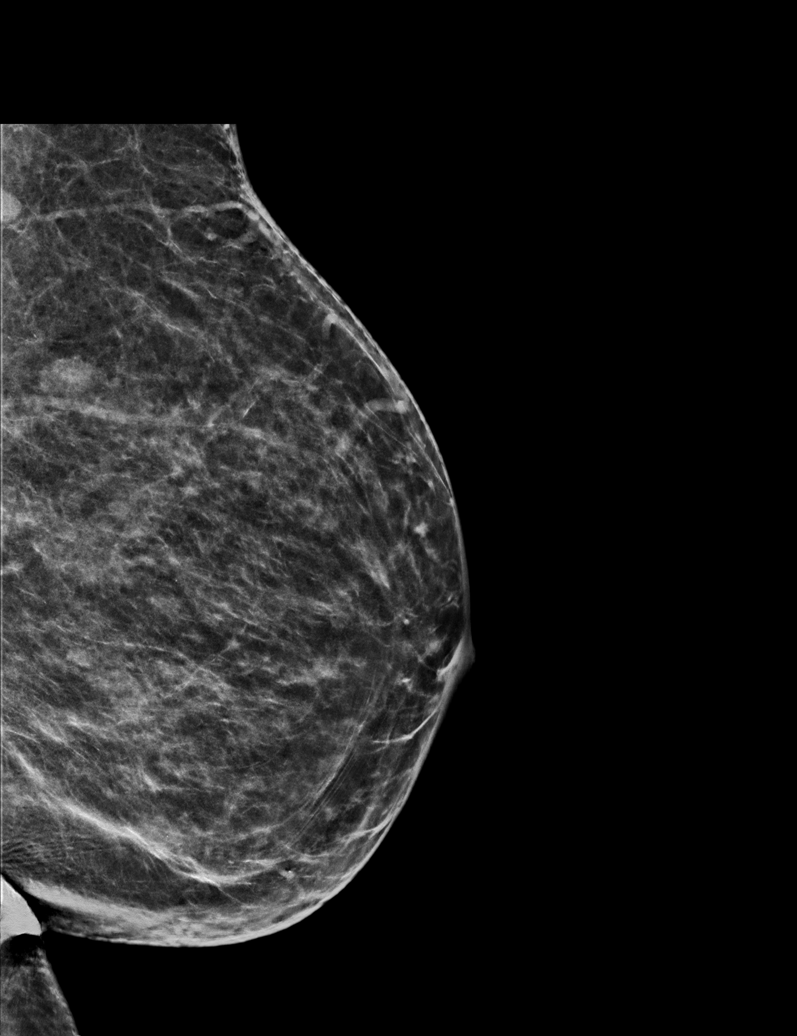

[R CC synth-2D (1 of 2)]
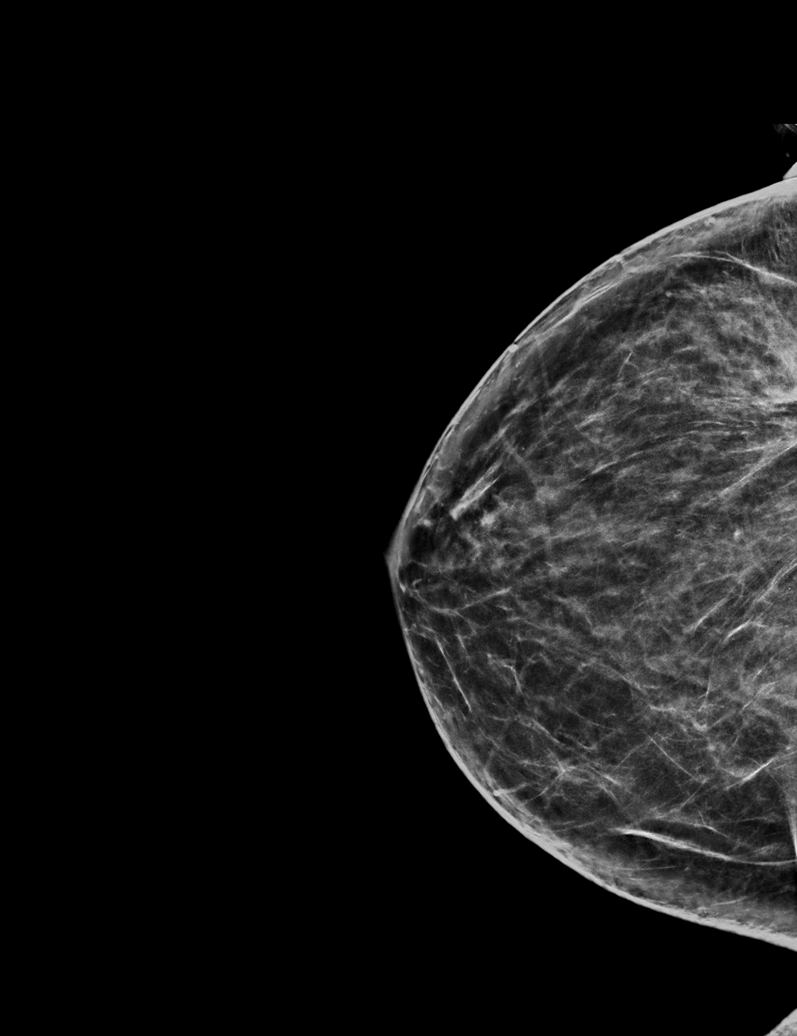

[R MLO synth-2D]
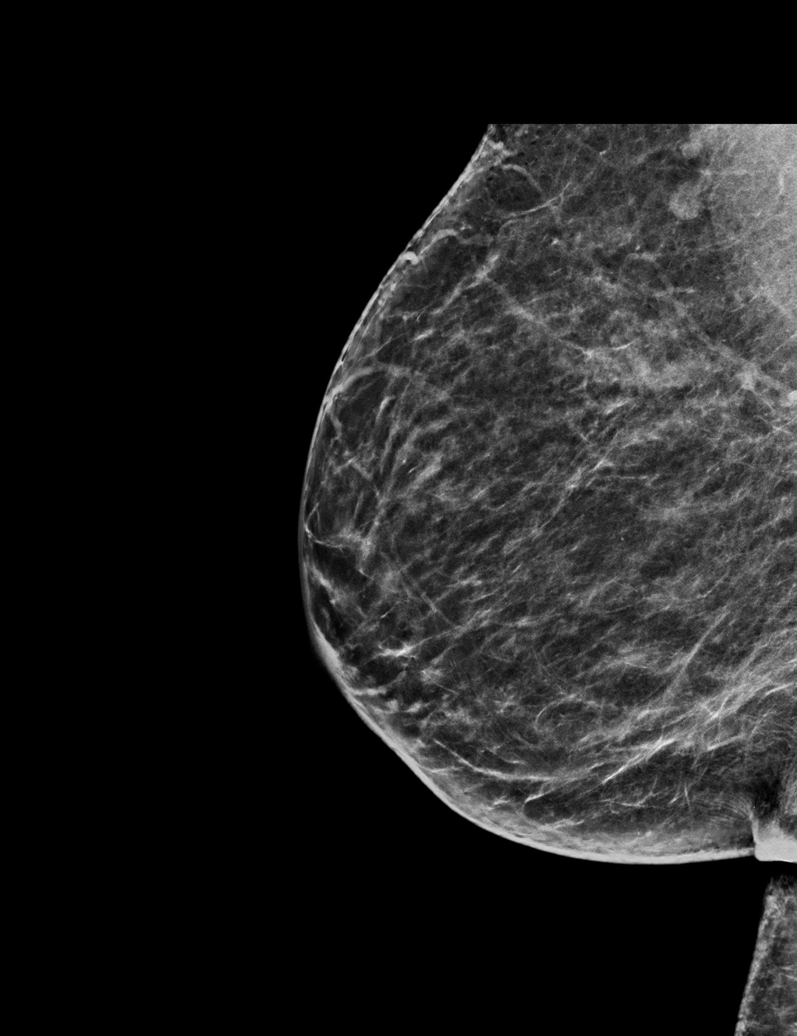

[R CC synth-2D (2 of 2)]
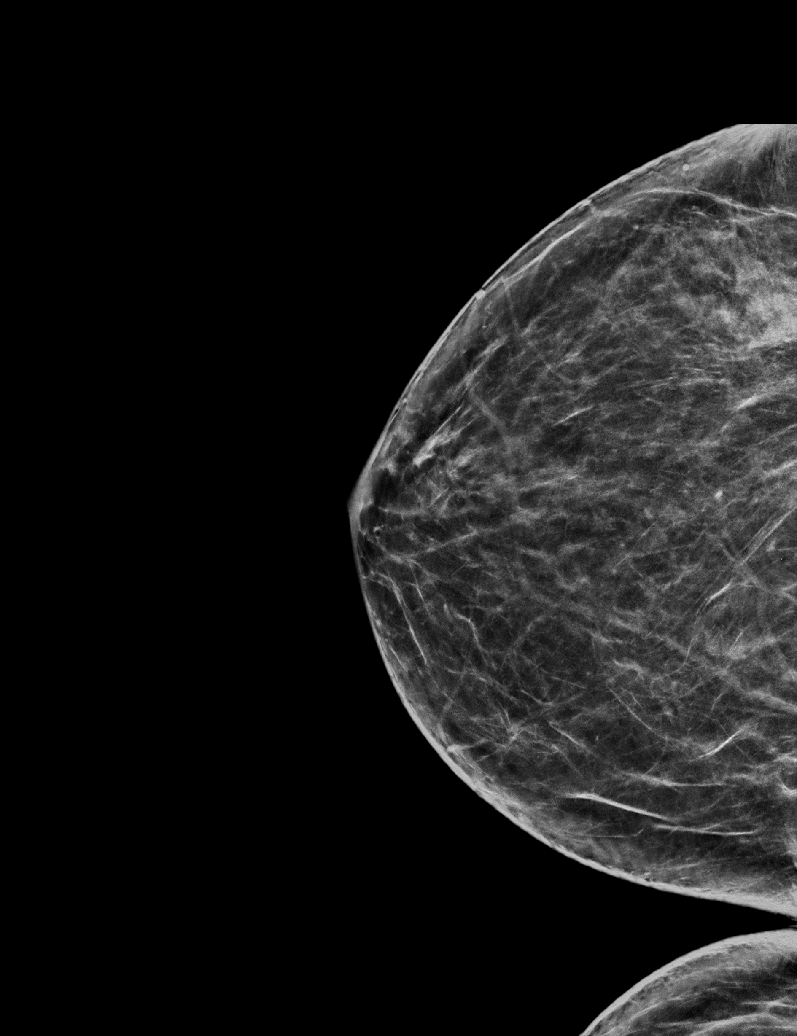

[6 of 36 positions shown; findings below may reference images not displayed]

ACR Breast Density Category b: There are scattered areas of
fibroglandular density.
FINDINGS: There are no findings suspicious for malignancy.
IMPRESSION: No mammographic evidence of malignancy. A result letter of this
screening mammogram will be mailed directly to the patient.

RECOMMENDATION:
Screening mammogram in one year. (Code:51-O-LD2)

BI-RADS CATEGORY  1: Negative.
